# Patient Record
Sex: Male | Born: 1990 | Race: Black or African American | Hispanic: No | Marital: Single | Smoking: Current every day smoker
Health system: Southern US, Community
[De-identification: ages and names within clinical notes are randomized; demographics above are authoritative.]

## PROBLEM LIST (undated history)

## (undated) DIAGNOSIS — J302 Other seasonal allergic rhinitis: Secondary | ICD-10-CM

---

## 2014-02-04 ENCOUNTER — Emergency Department (HOSPITAL_COMMUNITY)
Admission: EM | Admit: 2014-02-04 | Discharge: 2014-02-04 | Disposition: A | Discharge status: Home / Self Care | Payer: Self-pay | Attending: Emergency Medicine | Admitting: Emergency Medicine

## 2014-02-04 ENCOUNTER — Emergency Department (HOSPITAL_COMMUNITY): Payer: No Typology Code available for payment source

## 2014-02-04 ENCOUNTER — Encounter (HOSPITAL_COMMUNITY): Payer: Self-pay | Admitting: Emergency Medicine

## 2014-02-04 DIAGNOSIS — S6990XA Unspecified injury of unspecified wrist, hand and finger(s), initial encounter: Principal | ICD-10-CM

## 2014-02-04 DIAGNOSIS — S59919A Unspecified injury of unspecified forearm, initial encounter: Principal | ICD-10-CM

## 2014-02-04 DIAGNOSIS — M25532 Pain in left wrist: Secondary | ICD-10-CM

## 2014-02-04 DIAGNOSIS — F17200 Nicotine dependence, unspecified, uncomplicated: Secondary | ICD-10-CM | POA: Insufficient documentation

## 2014-02-04 DIAGNOSIS — S59909A Unspecified injury of unspecified elbow, initial encounter: Secondary | ICD-10-CM | POA: Insufficient documentation

## 2014-02-04 DIAGNOSIS — Y9389 Activity, other specified: Secondary | ICD-10-CM | POA: Insufficient documentation

## 2014-02-04 DIAGNOSIS — Y92410 Unspecified street and highway as the place of occurrence of the external cause: Secondary | ICD-10-CM | POA: Insufficient documentation

## 2014-02-04 DIAGNOSIS — M549 Dorsalgia, unspecified: Secondary | ICD-10-CM | POA: Insufficient documentation

## 2014-02-04 MED ORDER — ACETAMINOPHEN 500 MG PO TABS
1000.0000 mg | ORAL_TABLET | Freq: Once | ORAL | Status: AC
  Administered 2014-02-04: 1000 mg via ORAL
  Filled 2014-02-04: qty 2
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
## 2014-02-04 NOTE — ED Notes (Signed)
Patient transported to X-ray 
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
## 2014-02-11 NOTE — ED Provider Notes (Signed)
Medical screening examination/treatment/procedure(s) were performed by non-physician practitioner and as supervising physician I was immediately available for consultation/collaboration.   EKG Interpretation None        Christopher J. Pollina, MD 02/11/14 0905 

## 2015-12-11 ENCOUNTER — Encounter (HOSPITAL_COMMUNITY): Payer: Self-pay | Admitting: Emergency Medicine

## 2015-12-11 ENCOUNTER — Emergency Department (HOSPITAL_COMMUNITY): Payer: No Typology Code available for payment source

## 2015-12-11 ENCOUNTER — Emergency Department (HOSPITAL_COMMUNITY)
Admission: EM | Admit: 2015-12-11 | Discharge: 2015-12-11 | Disposition: A | Discharge status: Home / Self Care | Payer: No Typology Code available for payment source | Attending: Emergency Medicine | Admitting: Emergency Medicine

## 2015-12-11 DIAGNOSIS — S8991XA Unspecified injury of right lower leg, initial encounter: Secondary | ICD-10-CM | POA: Diagnosis not present

## 2015-12-11 DIAGNOSIS — Y9389 Activity, other specified: Secondary | ICD-10-CM | POA: Diagnosis not present

## 2015-12-11 DIAGNOSIS — F17210 Nicotine dependence, cigarettes, uncomplicated: Secondary | ICD-10-CM | POA: Insufficient documentation

## 2015-12-11 DIAGNOSIS — Y92410 Unspecified street and highway as the place of occurrence of the external cause: Secondary | ICD-10-CM | POA: Insufficient documentation

## 2015-12-11 DIAGNOSIS — Y998 Other external cause status: Secondary | ICD-10-CM | POA: Diagnosis not present

## 2015-12-11 DIAGNOSIS — M25561 Pain in right knee: Secondary | ICD-10-CM

## 2015-12-11 HISTORY — DX: Other seasonal allergic rhinitis: J30.2

## 2015-12-11 MED ORDER — IBUPROFEN 800 MG PO TABS: 800.0000 mg | ORAL_TABLET | Freq: Three times a day (TID) | ORAL | Status: DC

## 2015-12-11 MED ORDER — IBUPROFEN 800 MG PO TABS
800.0000 mg | ORAL_TABLET | Freq: Once | ORAL | Status: AC
  Administered 2015-12-11: 800 mg via ORAL
  Filled 2015-12-11: qty 1

## 2015-12-11 MED ORDER — METHOCARBAMOL 500 MG PO TABS: 500.0000 mg | ORAL_TABLET | Freq: Two times a day (BID) | ORAL | Status: DC
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
## 2015-12-11 NOTE — Discharge Instructions (Signed)
1. Medications: robaxin, ibuprofen, usual home medications 2. Treatment: rest, drink plenty of fluids, gentle stretching as discussed, alternate ice and heat 3. Follow Up: Please followup with your primary doctor in 3 days for discussion of your diagnoses and further evaluation after today's visit; if you do not have a primary care doctor use the resource guide provided to find one;  Return to the ER for severe back pain, numbness, tingling, loss of bowel or bladder control or other concerning symptoms

## 2015-12-11 NOTE — ED Notes (Signed)
Entered patient room to complete triage, patient on cell phone. Will attempt to complete triage when patient has finished phone call.
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
## 2015-12-15 NOTE — ED Notes (Signed)
Patient called asking for work note. Accessed patient's chart to see if one had been written, there wasn't. Explained to patient we could not give him another. He said he would call back when the doctor was here, then hung up before I could tell him otherwise.   Note created on 12/15/15 at 13:08

## 2015-12-16 ENCOUNTER — Emergency Department (HOSPITAL_COMMUNITY)
Admission: EM | Admit: 2015-12-16 | Discharge: 2015-12-16 | Disposition: A | Discharge status: Home / Self Care | Payer: No Typology Code available for payment source | Attending: Emergency Medicine | Admitting: Emergency Medicine

## 2015-12-16 ENCOUNTER — Encounter (HOSPITAL_COMMUNITY): Payer: Self-pay | Admitting: Emergency Medicine

## 2015-12-16 DIAGNOSIS — H6122 Impacted cerumen, left ear: Secondary | ICD-10-CM | POA: Diagnosis not present

## 2015-12-16 DIAGNOSIS — F17210 Nicotine dependence, cigarettes, uncomplicated: Secondary | ICD-10-CM | POA: Diagnosis not present

## 2015-12-16 DIAGNOSIS — R42 Dizziness and giddiness: Secondary | ICD-10-CM | POA: Insufficient documentation

## 2015-12-16 DIAGNOSIS — R51 Headache: Secondary | ICD-10-CM | POA: Diagnosis present

## 2015-12-16 LAB — CBC WITH DIFFERENTIAL/PLATELET
BASOS ABS: 0 10*3/uL (ref 0.0–0.1)
Basophils Relative: 0 %
Eosinophils Absolute: 0.1 10*3/uL (ref 0.0–0.7)
Eosinophils Relative: 1 %
HEMATOCRIT: 43.9 % (ref 39.0–52.0)
HEMOGLOBIN: 14.4 g/dL (ref 13.0–17.0)
Lymphocytes Relative: 23 %
Lymphs Abs: 2.6 10*3/uL (ref 0.7–4.0)
MCH: 28.2 pg (ref 26.0–34.0)
MCHC: 32.8 g/dL (ref 30.0–36.0)
MCV: 85.9 fL (ref 78.0–100.0)
MONOS PCT: 5 %
Monocytes Absolute: 0.5 10*3/uL (ref 0.1–1.0)
NEUTROS PCT: 71 %
Neutro Abs: 8.4 10*3/uL — ABNORMAL HIGH (ref 1.7–7.7)
Platelets: 248 10*3/uL (ref 150–400)
RBC: 5.11 MIL/uL (ref 4.22–5.81)
RDW: 13.5 % (ref 11.5–15.5)
WBC: 11.6 10*3/uL — ABNORMAL HIGH (ref 4.0–10.5)

## 2015-12-16 LAB — BASIC METABOLIC PANEL
ANION GAP: 10 (ref 5–15)
BUN: 13 mg/dL (ref 6–20)
CHLORIDE: 103 mmol/L (ref 101–111)
CO2: 29 mmol/L (ref 22–32)
Calcium: 9.5 mg/dL (ref 8.9–10.3)
Creatinine, Ser: 0.94 mg/dL (ref 0.61–1.24)
GFR calc Af Amer: >60 mL/min (ref >60)
GFR calc non Af Amer: >60 mL/min (ref >60)
Glucose, Bld: 98 mg/dL (ref 65–99)
Potassium: 4.2 mmol/L (ref 3.5–5.1)
Sodium: 142 mmol/L (ref 135–145)

## 2015-12-16 MED ORDER — HYDROGEN PEROXIDE 3 % EX SOLN
CUTANEOUS | Status: AC
  Administered 2015-12-16: 12:00:00
  Filled 2015-12-16: qty 473

## 2015-12-16 MED ORDER — ACETAMINOPHEN 325 MG PO TABS
650.0000 mg | ORAL_TABLET | Freq: Once | ORAL | Status: AC
  Administered 2015-12-16: 650 mg via ORAL
  Filled 2015-12-16: qty 2

## 2015-12-16 NOTE — ED Notes (Signed)
Remaining getting ear wax on removal.  Continue treatment

## 2015-12-16 NOTE — Discharge Instructions (Signed)
You may take Ibuprofen and/or Tylenol as prescribed over the counter as needed for headache and knee pain. Continue to drink at least six 8 ounce glasses of water daily to remain hydrated at home. Please follow up with a primary care provider from the Resource Guide provided below in 4-5 days if your headache and dizziness have not improved. Please return to the Emergency Department if symptoms worsen or new onset of fever, neck stiffness, visual changes, light sensitivity, abdominal pain, vomiting, urinary symptoms, numbness, tingling, weakness, seizures, syncope.
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
## 2016-01-06 NOTE — Procedures (Signed)
Left cerumen impaction is noted.  Wax was removed by syringing and manual debridement. Instructions for home care to prevent wax buildup are given.
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
## 2017-10-27 ENCOUNTER — Encounter (HOSPITAL_COMMUNITY): Payer: Self-pay | Admitting: Family Medicine

## 2017-10-27 ENCOUNTER — Ambulatory Visit (HOSPITAL_COMMUNITY)
Admission: EM | Admit: 2017-10-27 | Discharge: 2017-10-27 | Disposition: A | Discharge status: Home / Self Care | Payer: PRIVATE HEALTH INSURANCE | Attending: Family Medicine | Admitting: Family Medicine

## 2017-10-27 DIAGNOSIS — F17210 Nicotine dependence, cigarettes, uncomplicated: Secondary | ICD-10-CM | POA: Insufficient documentation

## 2017-10-27 DIAGNOSIS — Z202 Contact with and (suspected) exposure to infections with a predominantly sexual mode of transmission: Secondary | ICD-10-CM | POA: Insufficient documentation

## 2017-10-27 DIAGNOSIS — J069 Acute upper respiratory infection, unspecified: Secondary | ICD-10-CM | POA: Insufficient documentation

## 2017-10-27 DIAGNOSIS — Z79899 Other long term (current) drug therapy: Secondary | ICD-10-CM | POA: Insufficient documentation

## 2017-10-27 MED ORDER — FLUTICASONE PROPIONATE 50 MCG/ACT NA SUSP: 1.0000 | Freq: Every day | NASAL | 2 refills | Status: AC

## 2017-10-27 MED ORDER — BENZONATATE 100 MG PO CAPS: 200.0000 mg | ORAL_CAPSULE | Freq: Three times a day (TID) | ORAL | 0 refills | Status: AC

## 2017-10-27 MED ORDER — CETIRIZINE HCL 10 MG PO TABS: 10.0000 mg | ORAL_TABLET | Freq: Every day | ORAL | 0 refills | Status: AC

## 2017-10-27 NOTE — Discharge Instructions (Signed)
Push fluids to ensure adequate hydration and keep secretions thin.  Tylenol and/or ibuprofen as needed for pain or fevers.  Daily flonase. Daily zyrtec. May continue with dayquil/nyquil as needed. Tessalon every 8 hours as needed for cough. If symptoms worsen or do not improve in the next week to return to be seen or to follow up with your PCP.

## 2017-10-27 NOTE — ED Provider Notes (Signed)
[facility]    CSN: [id number] Arrival date & time: 10/27/17  1211     History   Chief Complaint Chief Complaint  Patient presents with  . Exposure to STD    HPI Keith Charles is a 27 y.o. male.   Keith Charles presents with complaints of cough congestion, mild headache which started 2-3 days ago. Worse at night. Has a history of seasonal allergies. Tried allergy treatment which has not helped. Has had ill coworkers. No known fevers. Denies  Sore throat. Rates pain 6/10. Took dayquil this morning at 0800 which did help. Denies gi/gu complaints. Has not taken any allergy medication today. Without other contributing medical history.    ROS per HPI.      Past Medical History:  Diagnosis Date  . Seasonal allergies     There are no active problems to display for this patient.   History reviewed. No pertinent surgical history.     Home Medications    Prior to Admission medications   Medication Sig Start Date End Date Taking? Authorizing Provider  benzonatate (TESSALON) 100 MG capsule Take 2 capsules (200 mg total) by mouth every 8 (eight) hours. 10/27/17   Georgetta HaberBurky, Gregorio Worley B, NP  cetirizine (ZYRTEC) 10 MG tablet Take 1 tablet (10 mg total) by mouth daily. 10/27/17   Georgetta HaberBurky, Shawnee Higham B, NP  fluticasone (FLONASE) 50 MCG/ACT nasal spray Place 1 spray into both nostrils daily. 10/27/17   Georgetta HaberBurky, Kaylan Friedmann B, NP  loratadine (CLARITIN) 10 MG tablet Take 10 mg by mouth daily as needed for allergies.    [provider]    Family History History reviewed. No pertinent family history.  Social History Social History   Tobacco Use  . Smoking status: Current Every Day Smoker    Packs/day: 0.10    Types: Cigarettes  Substance Use Topics  . Alcohol use: Yes    Comment: social  . Drug use: No     Allergies   Patient has no known allergies.   Review of Systems Review of Systems   Physical Exam Triage Vital Signs ED Triage Vitals  Enc Vitals Group     BP  10/27/17 1253 (!) 142/82     Pulse Rate 10/27/17 1253 62     Resp 10/27/17 1253 16     Temp 10/27/17 1253 98.7 F (37.1 C)     Temp Source 10/27/17 1253 Oral     SpO2 10/27/17 1253 97 %     Weight --      Height --      Head Circumference --      Peak Flow --      Pain Score 10/27/17 1316 0     Pain Loc --      Pain Edu? --      Excl. in GC? --    No data found.  Updated Vital Signs BP (!) 142/82 (BP Location: Right Arm)   Pulse 62   Temp 98.7 F (37.1 C) (Oral)   Resp 16   SpO2 97%   Visual Acuity Right Eye Distance:   Left Eye Distance:   Bilateral Distance:    Right Eye Near:   Left Eye Near:    Bilateral Near:     Physical Exam  Constitutional: He is oriented to person, place, and time. He appears well-developed and well-nourished.  HENT:  Head: Normocephalic and atraumatic.  Right Ear: Tympanic membrane, external ear and ear canal normal.  Left Ear: Tympanic membrane, external ear and ear  canal normal.  Nose: Mucosal edema and rhinorrhea present. Right sinus exhibits no maxillary sinus tenderness and no frontal sinus tenderness. Left sinus exhibits no maxillary sinus tenderness and no frontal sinus tenderness.  Mouth/Throat: Uvula is midline, oropharynx is clear and moist and mucous membranes are normal.  Eyes: Pupils are equal, round, and reactive to light. Conjunctivae are normal.  Neck: Normal range of motion.  Cardiovascular: Normal rate and regular rhythm.  Pulmonary/Chest: Effort normal and breath sounds normal.  Without cough throughout exam   Lymphadenopathy:    He has no cervical adenopathy.  Neurological: He is alert and oriented to person, place, and time.  Skin: Skin is warm and dry.  Vitals reviewed.    UC Treatments / Results  Labs (all labs ordered are listed, but only abnormal results are displayed) Labs Reviewed  URINE CYTOLOGY ANCILLARY ONLY    EKG None Radiology No results found.  Procedures Procedures (including critical  care time)  Medications Ordered in UC Medications - No data to display   Initial Impression / Assessment and Plan / UC Course  I have reviewed the triage vital signs and the nursing notes.  Pertinent labs & imaging results that were available during my care of the patient were reviewed by me and considered in my medical decision making (see chart for details).     Non toxic in appearance. Afebrile. Vitals stable. History and physical consistent with viral illness.  Supportive cares recommended. If symptoms worsen or do not improve in the next week to return to be seen or to follow up with PCP. Patient verbalized understanding and agreeable to plan.    Final Clinical Impressions(s) / UC Diagnoses   Final diagnoses:  Acute upper respiratory infection    ED Discharge Orders        Ordered    fluticasone (FLONASE) 50 MCG/ACT nasal spray  Daily     10/27/17 1330    cetirizine (ZYRTEC) 10 MG tablet  Daily     10/27/17 1330    benzonatate (TESSALON) 100 MG capsule  Every 8 hours     10/27/17 1330       Controlled Substance Prescriptions [place] Controlled Substance Registry consulted? Not Applicable   Georgetta Haber, NP 10/27/17 1334

## 2017-10-29 LAB — URINE CYTOLOGY ANCILLARY ONLY
Chlamydia: NEGATIVE
Neisseria Gonorrhea: NEGATIVE
Trichomonas: NEGATIVE

## 2021-08-14 ENCOUNTER — Encounter (HOSPITAL_COMMUNITY): Payer: Self-pay

## 2021-08-14 ENCOUNTER — Other Ambulatory Visit: Payer: Self-pay

## 2021-08-14 ENCOUNTER — Emergency Department (HOSPITAL_COMMUNITY)
Admission: EM | Admit: 2021-08-14 | Discharge: 2021-08-15 | Disposition: A | Discharge status: Home / Self Care | Payer: PRIVATE HEALTH INSURANCE | Attending: Emergency Medicine | Admitting: Emergency Medicine

## 2021-08-14 DIAGNOSIS — J069 Acute upper respiratory infection, unspecified: Secondary | ICD-10-CM | POA: Diagnosis not present

## 2021-08-14 DIAGNOSIS — Z7951 Long term (current) use of inhaled steroids: Secondary | ICD-10-CM | POA: Diagnosis not present

## 2021-08-14 DIAGNOSIS — Z20822 Contact with and (suspected) exposure to covid-19: Secondary | ICD-10-CM | POA: Insufficient documentation

## 2021-08-14 DIAGNOSIS — R0602 Shortness of breath: Secondary | ICD-10-CM | POA: Diagnosis present

## 2021-08-14 NOTE — ED Provider Notes (Signed)
[facility]-EMERGENCY DEPT Provider Note   CSN: [id number] Arrival date & time: 08/14/21  2248     History  Chief Complaint  Patient presents with   Shortness of Breath   Nasal Congestion   Generalized Body Aches   Headache    Keith Charles is a 31 y.o. male.   Shortness of Breath Associated symptoms: headaches   Headache  31 year old male presenting to the emergency department with URI symptoms.  The patient states that he has had roughly 2 days of mild frontal headache, nasal congestion, shortness of breath and myalgias.  He denies any fevers.  He is tolerating oral intake.  He denies any other complaints at this time.  Home Medications Prior to Admission medications   Medication Sig Start Date End Date Taking? Authorizing Provider  benzonatate (TESSALON) 100 MG capsule Take 2 capsules (200 mg total) by mouth every 8 (eight) hours. 10/27/17   Georgetta Haber, NP  cetirizine (ZYRTEC) 10 MG tablet Take 1 tablet (10 mg total) by mouth daily. 10/27/17   Georgetta Haber, NP  fluticasone (FLONASE) 50 MCG/ACT nasal spray Place 1 spray into both nostrils daily. 10/27/17   Georgetta Haber, NP  loratadine (CLARITIN) 10 MG tablet Take 10 mg by mouth daily as needed for allergies.    [provider]      Allergies    Patient has no known allergies.    Review of Systems   Review of Systems  Respiratory:  Positive for shortness of breath.   Neurological:  Positive for headaches.   Physical Exam Updated Vital Signs BP 137/84    Pulse 68    Temp 98.8 F (37.1 C) (Oral)    Resp 18    SpO2 99%  Physical Exam Vitals and nursing note reviewed.  Constitutional:      General: He is not in acute distress.    Appearance: He is well-developed.  HENT:     Head: Normocephalic and atraumatic.     Mouth/Throat:     Pharynx: Posterior oropharyngeal erythema present. No oropharyngeal exudate.  Eyes:     Conjunctiva/sclera: Conjunctivae normal.     Pupils:  Pupils are equal, round, and reactive to light.  Cardiovascular:     Rate and Rhythm: Normal rate and regular rhythm.     Heart sounds: No murmur heard. Pulmonary:     Effort: Pulmonary effort is normal. No respiratory distress.     Breath sounds: Normal breath sounds.  Abdominal:     General: There is no distension.     Palpations: Abdomen is soft.     Tenderness: There is no abdominal tenderness. There is no guarding.  Musculoskeletal:        General: No swelling, deformity or signs of injury.     Cervical back: Neck supple.  Skin:    General: Skin is warm and dry.     Capillary Refill: Capillary refill takes less than 2 seconds.     Findings: No lesion or rash.  Neurological:     General: No focal deficit present.     Mental Status: He is alert. Mental status is at baseline.  Psychiatric:        Mood and Affect: Mood normal.    ED Results / Procedures / Treatments   Labs (all labs ordered are listed, but only abnormal results are displayed) Labs Reviewed  RESP PANEL BY RT-PCR (FLU A&B, COVID) [id number]    EKG EKG Interpretation  Date/Time:  Sunday  August 14 2021 23:05:47 EST Ventricular Rate:  62 PR Interval:  124 QRS Duration: 96 QT Interval:  365 QTC Calculation: 371 R Axis:   62 Text Interpretation: Sinus rhythm Probable left ventricular hypertrophy Nonspecific T abnormalities, diffuse leads No prior EKGs for comparison Reconfirmed by Ernie Avena ([id number]) on 08/15/2021 2:59:35 AM  Radiology No results found.  Procedures Procedures    Medications Ordered in ED Medications - No data to display  ED Course/ Medical Decision Making/ A&P                           Medical Decision Making   31 year old male presenting to the emergency department with URI symptoms.  The patient states that he has had roughly 2 days of mild frontal headache, nasal congestion, shortness of breath and myalgias.  He denies any fevers.  He is tolerating oral intake.  He denies any  other complaints at this time.  On arrival, the patient was afebrile, hemodynamically stable, saturating well on room air.  Physical exam for significant for lungs that were clear to auscultation bilaterally, oropharynx with mild erythema.  No evidence of PTA or RPA.  Patient presenting with URI symptoms for the past 2 days.  He has lungs that are clear to auscultation bilaterally.  He has no productive cough.  He felt that symptoms were consistent with mild sinusitis but also is endorsing some shortness of breath.  Patient denies any chest pain at this time.  He is low risk for ACS.  He has mild T wave inversions in the inferior leads.  At this time, will defer further testing from a cardiac standpoint as he is low risk and without chest pain.  COVID-19 and influenza PCR testing was collected and resulted negative.  Patient denies any chest pain at this time.  Given reassuring physical exam, I do not think further imaging work-up is warranted at this time.  Low concern for acute bacterial infection.  Advised symptomatic management with Tylenol, ibuprofen, oral rehydration with plenty of fluids.  Return for worsening symptoms.   Final Clinical Impression(s) / ED Diagnoses Final diagnoses:  Viral upper respiratory tract infection    Rx / DC Orders ED Discharge Orders     None         Ernie Avena, MD 08/15/21 0300

## 2021-08-14 NOTE — ED Triage Notes (Addendum)
Pt reports with SHOB, headache, congestion, and generalized body aches x 2 days.

## 2021-08-15 LAB — RESP PANEL BY RT-PCR (FLU A&B, COVID) ARPGX2
Influenza A by PCR: NEGATIVE
Influenza B by PCR: NEGATIVE
SARS Coronavirus 2 by RT PCR: NEGATIVE

## 2021-08-15 NOTE — Discharge Instructions (Addendum)
You were evaluated in the Emergency Department and after careful evaluation, we did not find any emergent condition requiring admission or further testing in the hospital.  Your exam/testing today was overall reassuring.  Symptoms are consistent with a viral upper respiratory infection.  Your COVID-19 and influenza testing was normal.  Recommend Tylenol and ibuprofen for pain, fever, muscle aches, continue to push oral rehydration.  Please return to the Emergency Department if you experience any worsening of your condition.  Thank you for allowing [place] to be a part of your care.

## 2023-09-04 ENCOUNTER — Encounter (HOSPITAL_COMMUNITY): Payer: Self-pay

## 2023-09-04 ENCOUNTER — Emergency Department (HOSPITAL_COMMUNITY)
Admission: EM | Admit: 2023-09-04 | Discharge: 2023-09-04 | Disposition: A | Discharge status: Home / Self Care | Payer: PRIVATE HEALTH INSURANCE | Attending: Emergency Medicine | Admitting: Emergency Medicine

## 2023-09-04 ENCOUNTER — Other Ambulatory Visit: Payer: Self-pay

## 2023-09-04 DIAGNOSIS — J111 Influenza due to unidentified influenza virus with other respiratory manifestations: Secondary | ICD-10-CM | POA: Diagnosis not present

## 2023-09-04 DIAGNOSIS — R051 Acute cough: Secondary | ICD-10-CM

## 2023-09-04 DIAGNOSIS — Z20822 Contact with and (suspected) exposure to covid-19: Secondary | ICD-10-CM | POA: Insufficient documentation

## 2023-09-04 LAB — RESP PANEL BY RT-PCR (RSV, FLU A&B, COVID)  RVPGX2
Influenza A by PCR: POSITIVE — AB
Influenza B by PCR: NEGATIVE
Resp Syncytial Virus by PCR: NEGATIVE
SARS Coronavirus 2 by RT PCR: NEGATIVE

## 2023-09-04 MED ORDER — OSELTAMIVIR PHOSPHATE 75 MG PO CAPS: 75.0000 mg | ORAL_CAPSULE | Freq: Two times a day (BID) | ORAL | 0 refills | Status: AC

## 2023-09-04 MED ORDER — HYDROCOD POLI-CHLORPHE POLI ER 10-8 MG/5ML PO SUER: 5.0000 mL | Freq: Every evening | ORAL | 0 refills | Status: AC | PRN

## 2023-09-04 MED ORDER — ONDANSETRON 4 MG PO TBDP: 4.0000 mg | ORAL_TABLET | Freq: Four times a day (QID) | ORAL | 0 refills | Status: AC | PRN

## 2023-09-04 NOTE — ED Triage Notes (Signed)
C/o cough, body aches, congestion, headache, and sore throat.  Pt reports taking tylenol at 2pm.

## 2023-09-04 NOTE — ED Provider Notes (Signed)
[place] EMERGENCY DEPARTMENT AT [facility] Provider Note   CSN: [id number] Arrival date & time: 09/04/23  1629     History  Chief Complaint  Patient presents with   Cough    Keith Charles is a 33 y.o. male with past medical history significant for allergies, tobacco abuse who presents with concern for cough, body aches, congestion, headache, sore throat since last night.  Reports taking some Tylenol around 2 PM with some relief.  Reports that his cough kept him from sleeping last night he endorses some chest pain with cough.  Denies shortness of breath, does endorse some nausea without vomiting.   Cough Associated symptoms: myalgias        Home Medications Prior to Admission medications   Medication Sig Start Date End Date Taking? Authorizing Provider  chlorpheniramine-HYDROcodone (TUSSIONEX) 10-8 MG/5ML Take 5 mLs by mouth at bedtime as needed for cough. 09/04/23  Yes Marky Buresh H, PA-C  ondansetron (ZOFRAN-ODT) 4 MG disintegrating tablet Take 1 tablet (4 mg total) by mouth every 6 (six) hours as needed for nausea or vomiting. 09/04/23  Yes Curry Seefeldt H, PA-C  oseltamivir (TAMIFLU) 75 MG capsule Take 1 capsule (75 mg total) by mouth every 12 (twelve) hours. 09/04/23  Yes Wilber Fini H, PA-C  benzonatate (TESSALON) 100 MG capsule Take 2 capsules (200 mg total) by mouth every 8 (eight) hours. 10/27/17   Georgetta Haber, NP  cetirizine (ZYRTEC) 10 MG tablet Take 1 tablet (10 mg total) by mouth daily. 10/27/17   Georgetta Haber, NP  fluticasone (FLONASE) 50 MCG/ACT nasal spray Place 1 spray into both nostrils daily. 10/27/17   Georgetta Haber, NP  loratadine (CLARITIN) 10 MG tablet Take 10 mg by mouth daily as needed for allergies.    [provider]      Allergies    Patient has no known allergies.    Review of Systems   Review of Systems  Respiratory:  Positive for cough.   Musculoskeletal:  Positive for myalgias.  All other  systems reviewed and are negative.   Physical Exam Updated Vital Signs BP 130/75 (BP Location: Right Arm)   Pulse 97   Temp 99.4 F (37.4 C) (Oral)   Resp 17   Wt 75.8 kg   SpO2 97%   BMI 23.96 kg/m  Physical Exam Vitals and nursing note reviewed.  Constitutional:      General: He is not in acute distress.    Appearance: Normal appearance.  HENT:     Head: Normocephalic and atraumatic.     Mouth/Throat:     Comments: No significant posterior oropharynx erythema, swelling, exudate. Uvula midline, tonsils 1+ bilaterally.  No trismus, stridor, evidence of PTA, floor of mouth swelling or redness.   Eyes:     General:        Right eye: No discharge.        Left eye: No discharge.  Cardiovascular:     Rate and Rhythm: Normal rate and regular rhythm.  Pulmonary:     Effort: Pulmonary effort is normal. No respiratory distress.     Comments: No wheezing, rhonchi, stridor, rales.  Occasional dry cough. Musculoskeletal:        General: No deformity.  Skin:    General: Skin is warm and dry.  Neurological:     Mental Status: He is alert and oriented to person, place, and time.  Psychiatric:        Mood and Affect: Mood normal.  Behavior: Behavior normal.     ED Results / Procedures / Treatments   Labs (all labs ordered are listed, but only abnormal results are displayed) Labs Reviewed  RESP PANEL BY RT-PCR (RSV, FLU A&B, COVID)  RVPGX2 - Abnormal; Notable for the following components:      Result Value   Influenza A by PCR POSITIVE (*)    All other components within normal limits    EKG None  Radiology No results found.  Procedures Procedures    Medications Ordered in ED Medications - No data to display  ED Course/ Medical Decision Making/ A&P                                 Medical Decision Making Risk Prescription drug management.   This is a well-appearing 33 yo male who presents with concern for 1 days of cough, fever, sore throat, headache .   My emergent differential diagnosis includes acute upper respiratory infection with COVID, flu, RSV versus new asthma presentation, acute bronchitis, less clinical concern for pneumonia.  Also considered other ENT emergencies, Ludwig angina, strep pharyngitis, mono, versus epiglottis, tonsillitis versus other.  This is not an exhaustive differential.  On my exam patient is overall well-appearing, they have temperature of 99.4, breathing unlabored, no tachypnea, no respiratory distress, stable oxygen saturation.  Patient without tachycardia.  RVP independently reviewed by myself shows positive for flu.  Patient symptoms are consistent with flu encouraged ibuprofen, Tylenol, rest, plenty of fluids.  Given nausea will prescribe Zofran, since within 24 hours of symptom onset I think that Tamiflu is reasonable, patient understands side effect profile and would like to treat, additionally given chest pain with cough, cough keeping him from sleeping will prescribe short course of Tussionex.  Discussed extensive return precautions.  Patient discharged in stable condition at this time. Final Clinical Impression(s) / ED Diagnoses Final diagnoses:  Flu  Acute cough    Rx / DC Orders ED Discharge Orders          Ordered    chlorpheniramine-HYDROcodone (TUSSIONEX) 10-8 MG/5ML  At bedtime PRN        09/04/23 2005    oseltamivir (TAMIFLU) 75 MG capsule  Every 12 hours        09/04/23 2005    ondansetron (ZOFRAN-ODT) 4 MG disintegrating tablet  Every 6 hours PRN        09/04/23 2005              [place] 09/04/23 2007    Long, Joshua G, MD 09/07/23 1544

## 2023-09-04 NOTE — Discharge Instructions (Signed)
You have the flu this is a viral infection that will likely start to improve after 7-10 days, antibiotics are not helpful in treating viral infections.  You may take Tamiflu twice a day for the next 5 days this will help shorten the duration of the flu and prevent complications, this medication can cause some upset stomach, nausea and diarrhea. You may use Zofran as needed for nausea. Please make sure you are drinking plenty of fluids. You can treat your symptoms supportively with tylenol 650 mg/1000mg  and ibuprofen 600 mg every 6 hours for fevers and pains. For nasal congestion you can use Zyrtec and Flonase to help with nasal congestion. To treat cough you can use over the counter cough medications such as Mucinex DM or Robitussin and throat lozenges. If your symptoms are not improving please follow up with you Primary doctor.   If you develop persistent fevers, shortness of breath or difficulty breathing, chest pain, severe headache and neck pain, persistent nausea and vomiting or other new or concerning symptoms return to the Emergency department.

## 2023-09-12 ENCOUNTER — Telehealth: Payer: Self-pay | Admitting: *Deleted

## 2023-09-12 NOTE — Progress Notes (Signed)
 Transition Care Management Unsuccessful Follow-up Telephone Call  Date of discharge and from where:  [facility]  09/04/2023  Attempts: 1st  Reason for unsuccessful TCM follow-up call:  No answer/busy

## 2024-05-26 ENCOUNTER — Other Ambulatory Visit: Payer: Self-pay

## 2024-05-26 ENCOUNTER — Encounter (HOSPITAL_COMMUNITY): Payer: Self-pay

## 2024-05-26 ENCOUNTER — Emergency Department (HOSPITAL_COMMUNITY)
Admission: EM | Admit: 2024-05-26 | Discharge: 2024-05-26 | Disposition: A | Discharge status: Home / Self Care | Payer: PRIVATE HEALTH INSURANCE | Attending: Emergency Medicine | Admitting: Emergency Medicine

## 2024-05-26 ENCOUNTER — Telehealth (HOSPITAL_COMMUNITY): Payer: Self-pay

## 2024-05-26 ENCOUNTER — Emergency Department (HOSPITAL_COMMUNITY): Payer: PRIVATE HEALTH INSURANCE

## 2024-05-26 DIAGNOSIS — M25561 Pain in right knee: Secondary | ICD-10-CM | POA: Diagnosis present

## 2024-05-26 DIAGNOSIS — W108XXA Fall (on) (from) other stairs and steps, initial encounter: Secondary | ICD-10-CM | POA: Diagnosis not present

## 2024-05-26 MED ORDER — KETOROLAC TROMETHAMINE 15 MG/ML IJ SOLN
15.0000 mg | Freq: Once | INTRAMUSCULAR | Status: AC
  Administered 2024-05-26: 15 mg via INTRAMUSCULAR
  Filled 2024-05-26: qty 1

## 2024-05-26 MED ORDER — NAPROXEN 500 MG PO TABS: 500.0000 mg | ORAL_TABLET | Freq: Two times a day (BID) | ORAL | 0 refills | Status: AC

## 2024-05-26 MED ORDER — NAPROXEN 500 MG PO TABS: 500.0000 mg | ORAL_TABLET | Freq: Two times a day (BID) | ORAL | 0 refills | Status: DC

## 2024-05-26 NOTE — ED Triage Notes (Signed)
 Pt reports with right knee pain after falling on it Friday. Pt ambulatory but with pain.

## 2024-05-26 NOTE — ED Provider Notes (Signed)
 [place] EMERGENCY DEPARTMENT AT [facility] Provider Note   CSN: [id number] Arrival date & time: 05/26/24  1141     Patient presents with: Knee Pain   Keith Charles is a 33 y.o. male.  Knee Pain  Patient is a 33 year old male presenting the ED today for concerns for right knee injury after he fell going up the stairs hitting his right knee on the stair, popped up like nothing happened and went about his day where he then slipped and hit his knee again on the ground where he then popped up like nothing happened and did not feel pain until he woke up in the middle the night to go use the bathroom and had a lot of pain when bearing weight on that knee.  He says he has been intermittently  experiencing symptoms.  Notes that he is able to ambulate.  Denies numbness, weakness, tingling.  Has full range of motion.    Prior to Admission medications   Medication Sig Start Date End Date Taking? Authorizing Provider  naproxen (NAPROSYN) 500 MG tablet Take 1 tablet (500 mg total) by mouth 2 (two) times daily. 05/26/24  Yes Beola Terrall RAMAN, PA-C  benzonatate  (TESSALON ) 100 MG capsule Take 2 capsules (200 mg total) by mouth every 8 (eight) hours. 10/27/17   Burky, Natalie B, NP  cetirizine  (ZYRTEC ) 10 MG tablet Take 1 tablet (10 mg total) by mouth daily. 10/27/17   Burky, Natalie B, NP  chlorpheniramine-HYDROcodone (TUSSIONEX) 10-8 MG/5ML Take 5 mLs by mouth at bedtime as needed for cough. 09/04/23   Prosperi, Christian H, PA-C  fluticasone  (FLONASE ) 50 MCG/ACT nasal spray Place 1 spray into both nostrils daily. 10/27/17   Burky, Natalie B, NP  loratadine (CLARITIN) 10 MG tablet Take 10 mg by mouth daily as needed for allergies.    [provider]  ondansetron  (ZOFRAN -ODT) 4 MG disintegrating tablet Take 1 tablet (4 mg total) by mouth every 6 (six) hours as needed for nausea or vomiting. 09/04/23   Prosperi, Christian H, PA-C  oseltamivir  (TAMIFLU ) 75 MG capsule Take 1 capsule  (75 mg total) by mouth every 12 (twelve) hours. 09/04/23   Prosperi, Christian H, PA-C    Allergies: Patient has no known allergies.    Review of Systems  Musculoskeletal:  Positive for arthralgias.  All other systems reviewed and are negative.   Updated Vital Signs BP (!) 147/82 (BP Location: Right Arm)   Pulse (!) 50   Temp 97.6 F (36.4 C) (Oral)   Resp 18   SpO2 100%   Physical Exam Vitals and nursing note reviewed.  Constitutional:      General: He is not in acute distress.    Appearance: Normal appearance. He is not ill-appearing or diaphoretic.  HENT:     Head: Normocephalic and atraumatic.  Eyes:     General: No scleral icterus.       Right eye: No discharge.        Left eye: No discharge.     Extraocular Movements: Extraocular movements intact.     Conjunctiva/sclera: Conjunctivae normal.  Cardiovascular:     Rate and Rhythm: Normal rate and regular rhythm.     Pulses: Normal pulses.     Heart sounds: Normal heart sounds. No murmur heard.    No friction rub. No gallop.  Pulmonary:     Effort: Pulmonary effort is normal. No respiratory distress.     Breath sounds: No stridor. No wheezing, rhonchi or rales.  Chest:  Chest wall: No tenderness.  Abdominal:     General: Abdomen is flat. There is no distension.     Palpations: Abdomen is soft.     Tenderness: There is no abdominal tenderness. There is no right CVA tenderness, left CVA tenderness, guarding or rebound.  Musculoskeletal:        General: Tenderness (Noted to have some mild tenderness noted to the superior aspects of the patella.) present. No swelling, deformity or signs of injury. Normal range of motion.     Cervical back: Normal range of motion. No rigidity.     Right lower leg: No edema.     Left lower leg: No edema.     Comments: DP pulse 2+.  Skin:    General: Skin is warm and dry.     Findings: No bruising, erythema or lesion.  Neurological:     General: No focal deficit present.      Mental Status: He is alert and oriented to person, place, and time. Mental status is at baseline.     Sensory: No sensory deficit.     Motor: No weakness.  Psychiatric:        Mood and Affect: Mood normal.     (all labs ordered are listed, but only abnormal results are displayed) Labs Reviewed - No data to display  EKG: None  Radiology: DG Knee Complete 4 Views Right Result Date: 05/26/2024 EXAM: 4 OR MORE VIEW(S) XRAY OF THE RIGHT KNEE 05/26/2024 01:12:00 PM COMPARISON: 12/11/2015 CLINICAL HISTORY: Fall. Right knee pain after falling on it Friday. Patient ambulatory but with pain. FINDINGS: BONES AND JOINTS: No acute fracture. No focal osseous lesion. No joint dislocation. Trace suprapatellar joint effusion. No significant degenerative changes. SOFT TISSUES: The soft tissues are unremarkable. IMPRESSION: 1. No acute fracture or dislocation. 2. Trace suprapatellar joint effusion. Electronically signed by: Waddell Calk MD 05/26/2024 02:49 PM EDT RP Workstation: [id number]    Procedures   Medications Ordered in the ED  ketorolac (TORADOL) 15 MG/ML injection 15 mg (has no administration in time range)      Medical Decision Making Amount and/or Complexity of Data Reviewed Radiology: ordered.  Risk Prescription drug management.  This patient is a 33 year old male who presents to the ED for concern of right knee pain after falling onto the knee twice in the same day, not having symptoms after that.   On physical exam, patient is in no acute distress, afebrile, alert and orient x 4, speaking in full sentences, nontachypneic, nontachycardic. Patient is ambulatory, has full range of motion at knee, with no swelling noted over patellar space.  Does note to have mild tenderness to the suprapatellar aspect.  Good DP pulse.  Unremarkable exam otherwise.  X-ray does show trace suprapatellar effusion but otherwise unremarkable.  Will provide knee sleeve and have follow-up with orthopedics  and provide anti-inflammatories.  Patient vital signs have remained stable throughout the course of patient's time in the ED. Low suspicion for any other emergent pathology at this time. I believe this patient is safe to be discharged. Provided strict return to ER precautions. Patient expressed agreement and understanding of plan. All questions were answered.  Differential diagnoses prior to evaluation: The emergent differential diagnosis includes, but is not limited to, fracture, ligamentous injury, neurovascular injury, dislocation, malalignment. This is not an exhaustive differential.   Past Medical History / Co-morbidities / Social History: No chronic past medical history  Additional history: Chart reviewed.  Lab Tests/Imaging studies: I personally interpreted labs/imaging and the  pertinent results include:    X-ray shows Trace subpatellar joint effusion I agree with the radiologist interpretation.  Medications: I ordered medication including Toradol.  I have reviewed the patients home medicines and have made adjustments as needed.  Critical Interventions: None  Social Determinants of Health: Does not have PCP  Disposition: After consideration of the diagnostic results and the patients response to treatment, I feel that the patient would benefit from discharge and treatment as above.   emergency department workup does not suggest an emergent condition requiring admission or immediate intervention beyond what has been performed at this time. The plan is: Symptomatic management at home, follow-up with orthopedic surgery as needed. The patient is safe for discharge and has been instructed to return immediately for worsening symptoms, change in symptoms or any other concerns.  Final diagnoses:  Acute pain of right knee    ED Discharge Orders          Ordered    naproxen (NAPROSYN) 500 MG tablet  2 times daily        05/26/24 1529               Beola Terrall RAMAN,  PA-C 05/26/24 1530    Tegeler, Lonni PARAS, MD 05/26/24 [phone]

## 2024-05-26 NOTE — Telephone Encounter (Cosign Needed)
 Sent rx of naproxen to other pharmacy,

## 2024-05-26 NOTE — Progress Notes (Signed)
 Orthopedic Tech Progress Note Patient Details:  Keith Charles 1990-11-26 [id number] Applied knee sleeve per order.  Ortho Devices Type of Ortho Device: Knee Sleeve Ortho Device/Splint Location: RLE Ortho Device/Splint Interventions: Ordered, Adjustment, Application   Post Interventions Patient Tolerated: Well Instructions Provided: Adjustment of device, Care of device, Poper ambulation with device  [name] 05/26/2024, 4:10 PM

## 2024-05-26 NOTE — Discharge Instructions (Addendum)
 He was seen today for right knee pain after fall.  Your x-ray did show some small amount of fluid in the knee but no other signs of fractures or dislocation.  Providing you a knee sleeve that can be used for pain management.  I will send you home with some anti-inflammatories, you can additionally can use Tylenol  for more pain relief.  Recommend icing and elevation.  Follow-up with orthopedic surgery if symptoms persist or if you begin to feel unstable to the knee.  Please take Naprosyn, 500mg  by mouth twice daily as needed for pain - this in an antiinflammatory medicine (NSAID) and is similar to ibuprofen  - many people feel that it is stronger than ibuprofen  and it is easier to take since it is a smaller pill.  Please use this only for 1 week - if your pain persists, you will need to follow up with your doctor in the office for ongoing guidance and pain control.   Take Tylenol  (acetominophen)  650mg  every 4-6 hours, as needed for pain or fever. Do not take more than 4,000 mg in a 24-hour period. As this may cause liver damage. While this is rare, if you begin to develop yellowing of the skin or eyes, stop taking and return to ER immediately.

## 2024-05-26 NOTE — ED Notes (Signed)
Ortho Tech at bed side to apply brace.
